# Patient Record
Sex: Female | Born: 1990 | Race: Black or African American | Hispanic: No | Marital: Single | Smoking: Never smoker
Health system: Southern US, Community
[De-identification: ages and names within clinical notes are randomized; demographics above are authoritative.]

---
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
## 2012-02-03 ENCOUNTER — Ambulatory Visit (INDEPENDENT_AMBULATORY_CARE_PROVIDER_SITE_OTHER): Payer: Federal, State, Local not specified - PPO

## 2012-02-03 ENCOUNTER — Ambulatory Visit: Payer: Federal, State, Local not specified - PPO

## 2012-03-01 ENCOUNTER — Ambulatory Visit (INDEPENDENT_AMBULATORY_CARE_PROVIDER_SITE_OTHER): Payer: Federal, State, Local not specified - PPO | Admitting: Physician Assistant

## 2012-03-01 DIAGNOSIS — IMO0001 Reserved for inherently not codable concepts without codable children: Secondary | ICD-10-CM

## 2012-03-01 DIAGNOSIS — Z309 Encounter for contraceptive management, unspecified: Secondary | ICD-10-CM

## 2012-03-01 MED ORDER — MEDROXYPROGESTERONE ACETATE 150 MG/ML IM SUSP
150.0000 mg | Freq: Once | INTRAMUSCULAR | Status: AC
  Administered 2012-03-01: 150 mg via INTRAMUSCULAR

## 2012-03-01 NOTE — Progress Notes (Signed)
   Patient ID: Breanna Holmes MRN: [id number], DOB: Dec 27, 1990, 21 y.o. Date of Encounter: 03/01/2012, 7:46 AM  Primary Physician: No primary provider on file.  Chief Complaint: Here for Depo-Provera injection  21 y.o. year old female here for Depo-Provera injection. Outside of her window. Last injection was 11/27/11. Window was 6/28-7/12. Patient needs a pregnancy test prior to administration of Depo-Provera. Pregnancy test negative. Ok to give Depo-Provera injection. Next injection due on 10/1-10/15. Patient was advised to use back up method for 7 days and repeat pregnancy test in 2 weeks. [facility] In was inadvertently Rx'd in April.    Results for orders placed in visit on 03/01/12  POCT URINE PREGNANCY      Component Value Range   Preg Test, Ur Negative      Signed, Eula Listen, PA-C 03/01/2012 7:46 AM

## 2012-05-26 ENCOUNTER — Encounter: Payer: Self-pay | Admitting: Physician Assistant

## 2012-05-26 ENCOUNTER — Ambulatory Visit (INDEPENDENT_AMBULATORY_CARE_PROVIDER_SITE_OTHER): Payer: Federal, State, Local not specified - PPO | Admitting: Physician Assistant

## 2012-05-26 VITALS — BP 106/60 | HR 92 | Temp 99.0°F | Resp 16 | Ht 64.0 in | Wt 138.2 lb

## 2012-05-26 DIAGNOSIS — Z3049 Encounter for surveillance of other contraceptives: Secondary | ICD-10-CM

## 2012-05-26 DIAGNOSIS — IMO0001 Reserved for inherently not codable concepts without codable children: Secondary | ICD-10-CM

## 2012-05-26 MED ORDER — MEDROXYPROGESTERONE ACETATE 150 MG/ML IM SUSP: 150.0000 mg | Freq: Once | INTRAMUSCULAR | Status: DC

## 2012-05-26 MED ORDER — MEDROXYPROGESTERONE ACETATE 150 MG/ML IM SUSP
150.0000 mg | Freq: Once | INTRAMUSCULAR | Status: AC
  Administered 2012-05-26: 150 mg via INTRAMUSCULAR

## 2012-05-26 NOTE — Progress Notes (Signed)
Here for Depo-Provera injection. Last dose 03/01/12. Ok to give. Return in 3 months for next injection.

## 2012-06-19 ENCOUNTER — Emergency Department (HOSPITAL_COMMUNITY): Payer: Federal, State, Local not specified - PPO

## 2012-06-19 ENCOUNTER — Emergency Department (HOSPITAL_COMMUNITY)
Admission: EM | Admit: 2012-06-19 | Discharge: 2012-06-19 | Disposition: A | Discharge status: Home / Self Care | Payer: Federal, State, Local not specified - PPO | Attending: Emergency Medicine | Admitting: Emergency Medicine

## 2012-06-19 ENCOUNTER — Encounter (HOSPITAL_COMMUNITY): Payer: Self-pay | Admitting: *Deleted

## 2012-06-19 DIAGNOSIS — S40019A Contusion of unspecified shoulder, initial encounter: Secondary | ICD-10-CM | POA: Insufficient documentation

## 2012-06-19 DIAGNOSIS — S40012A Contusion of left shoulder, initial encounter: Secondary | ICD-10-CM

## 2012-06-19 MED ORDER — IBUPROFEN 400 MG PO TABS
600.0000 mg | ORAL_TABLET | Freq: Once | ORAL | Status: AC
  Administered 2012-06-19: 600 mg via ORAL
  Filled 2012-06-19: qty 1

## 2012-06-19 MED ORDER — IBUPROFEN 600 MG PO TABS: 600.0000 mg | ORAL_TABLET | Freq: Four times a day (QID) | ORAL | Status: DC | PRN

## 2012-06-19 NOTE — Progress Notes (Signed)
Orthopedic Tech Progress Note Patient Details:  Breanna Holmes [birth year] [id number]  Ortho Devices Type of Ortho Device: Arm foam sling Ortho Device/Splint Location: (L) UE Ortho Device/Splint Interventions: Application   Jennye Moccasin 06/19/2012, 1:36 PM
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
## 2012-06-19 NOTE — Discharge Instructions (Signed)

## 2012-08-22 ENCOUNTER — Ambulatory Visit (INDEPENDENT_AMBULATORY_CARE_PROVIDER_SITE_OTHER): Payer: Federal, State, Local not specified - PPO | Admitting: Radiology

## 2012-08-22 DIAGNOSIS — IMO0001 Reserved for inherently not codable concepts without codable children: Secondary | ICD-10-CM

## 2012-08-22 DIAGNOSIS — Z309 Encounter for contraceptive management, unspecified: Secondary | ICD-10-CM

## 2012-08-22 DIAGNOSIS — Z23 Encounter for immunization: Secondary | ICD-10-CM

## 2012-08-22 MED ORDER — MEDROXYPROGESTERONE ACETATE 150 MG/ML IM SUSP
150.0000 mg | Freq: Once | INTRAMUSCULAR | Status: AC
  Administered 2012-08-22: 150 mg via INTRAMUSCULAR

## 2012-08-22 NOTE — Addendum Note (Signed)
Addended by: Marinus Maw on: 08/22/2012 03:30 PM   Modules accepted: Level of Service

## 2012-09-14 ENCOUNTER — Telehealth: Payer: Self-pay

## 2012-09-14 MED ORDER — MEDROXYPROGESTERONE ACETATE 150 MG/ML IM SUSP: 150.0000 mg | INTRAMUSCULAR | Status: DC

## 2012-09-14 NOTE — Telephone Encounter (Signed)
Patient's mother notified and voiced understanding.

## 2012-09-14 NOTE — Telephone Encounter (Signed)
Can we send order for depo provera?

## 2012-09-14 NOTE — Telephone Encounter (Signed)
ANGELA STATES HER DAUGHTER GET HER DEPO SHOTS HERE AND SHE IS GOING TO [employer] THE 4TH OF February, WOULD LIKE TO KNOW IF WE CALL IN A PRESCRIPTION FOR THAT SO THEY CAN GIVE TO HER THERE. PLEASE CALL [phone]   [facility] [address]

## 2012-09-14 NOTE — Telephone Encounter (Signed)
Rx sent to pharmacy. She will be due for annual exam in April, so should plan to come in for that, or establish somewhere if she will no longer be in town.

## 2013-05-14 ENCOUNTER — Ambulatory Visit (INDEPENDENT_AMBULATORY_CARE_PROVIDER_SITE_OTHER): Payer: Federal, State, Local not specified - PPO | Admitting: Emergency Medicine

## 2013-05-14 VITALS — BP 112/64 | HR 88 | Temp 99.2°F | Resp 20 | Ht 64.0 in | Wt 153.6 lb

## 2013-05-14 DIAGNOSIS — H66009 Acute suppurative otitis media without spontaneous rupture of ear drum, unspecified ear: Secondary | ICD-10-CM

## 2013-05-14 MED ORDER — AMOXICILLIN 500 MG PO CAPS: 500.0000 mg | ORAL_CAPSULE | Freq: Three times a day (TID) | ORAL | Status: DC

## 2013-05-14 NOTE — Patient Instructions (Addendum)

## 2013-05-14 NOTE — Progress Notes (Signed)
[facility] [address], [place] [address] [phone]  Date:  05/14/2013   Name:  Breanna Holmes   DOB:  13-May-1991   MRN:  [id number]  PCP:  No PCP Per Patient    Chief Complaint: Otitis Media   History of Present Illness:  Breanna Holmes is a 22 y.o. very pleasant female patient who presents with the following:  Pain in right ear since a week ago. Has sore throat.  No fever or chills. No coryza.  No cough, wheezing or shortness of breath.  No nausea or vomiting.  No stool change or rash.  No improvement with over the counter medications or other home remedies. Denies other complaint or health concern today.   There are no active problems to display for this patient.   History reviewed. No pertinent past medical history.  History reviewed. No pertinent past surgical history.  History  Substance Use Topics  . Smoking status: Never Smoker   . Smokeless tobacco: Not on file  . Alcohol Use: No    Family History  Problem Relation Age of Onset  . Hypertension Mother   . Hypertension Maternal Grandfather     No Known Allergies  Medication list has been reviewed and updated.  Current Outpatient Prescriptions on File Prior to Visit  Medication Sig Dispense Refill  . ibuprofen (ADVIL,MOTRIN) 600 MG tablet Take 1 tablet (600 mg total) by mouth every 6 (six) hours as needed for pain.  30 tablet  0  . medroxyPROGESTERone (DEPO-PROVERA) 150 MG/ML injection Inject 1 mL (150 mg total) into the muscle every 3 (three) months.  1 mL  5   No current facility-administered medications on file prior to visit.    Review of Systems:  As per HPI, otherwise negative.    Physical Examination: Filed Vitals:   05/14/13 1227  BP: 112/64  Pulse: 88  Temp: 99.2 F (37.3 C)  Resp: 20   Filed Vitals:   05/14/13 1227  Height: 5\' 4"  (1.626 m)  Weight: 153 lb 9.6 oz (69.673 kg)   Body mass index is 26.35 kg/(m^2). Ideal Body Weight: Weight in (lb) to  have BMI = 25: 145.3  GEN: WDWN, NAD, Non-toxic, A & O x 3 HEENT: Atraumatic, Normocephalic. Neck supple. No masses, No LAD. Ears and Nose: No external deformity.  Right otitis media  CV: RRR, No M/G/R. No JVD. No thrill. No extra heart sounds. PULM: CTA B, no wheezes, crackles, rhonchi. No retractions. No resp. distress. No accessory muscle use. ABD: S, NT, ND, +BS. No rebound. No HSM. EXTR: No c/c/e NEURO Normal gait.  PSYCH: Normally interactive. Conversant. Not depressed or anxious appearing.  Calm demeanor.    Assessment and Plan: Right otitis media Amoxicillin   Signed,  Phillips Odor, MD

## 2013-05-15 ENCOUNTER — Telehealth: Payer: Self-pay

## 2013-05-15 MED ORDER — HYDROCODONE-ACETAMINOPHEN 5-325 MG PO TABS
1.0000 | ORAL_TABLET | Freq: Four times a day (QID) | ORAL | Status: DC | PRN
Start: 2013-05-15 — End: 2015-10-30

## 2013-05-15 NOTE — Telephone Encounter (Signed)
ANGELA STATES HER DAUGHTER WAS SEEN FOR AN EARACHE AND GIVEN ANTIBIOTICS BUT SHE IS IN A LOT OF PAIN, CRIED ALL NIGHT, WOULD LIKE TO HAVE SOMETHING CALLED IN FOR PAIN. PLEASE CALL [phone]    [facility] [address]

## 2013-05-15 NOTE — Telephone Encounter (Signed)
Mother advised this has been sent.

## 2013-05-15 NOTE — Telephone Encounter (Signed)
rx printed.  Meds ordered this encounter  Medications  . HYDROcodone-acetaminophen (NORCO) 5-325 MG per tablet    Sig: Take 1 tablet by mouth every 6 (six) hours as needed for pain.    Dispense:  15 tablet    Refill:  0    Order Specific Question:  Supervising Provider    Answer:  DOOLITTLE, ROBERT P [[id number]]

## 2013-05-22 ENCOUNTER — Ambulatory Visit (INDEPENDENT_AMBULATORY_CARE_PROVIDER_SITE_OTHER): Payer: Federal, State, Local not specified - PPO | Admitting: Family Medicine

## 2013-05-22 VITALS — BP 120/70 | HR 75 | Temp 98.9°F | Resp 16 | Ht 63.5 in | Wt 152.0 lb

## 2013-05-22 DIAGNOSIS — G510 Bell's palsy: Secondary | ICD-10-CM

## 2013-05-22 MED ORDER — PREDNISONE 20 MG PO TABS: ORAL_TABLET | ORAL | Status: DC

## 2013-05-22 NOTE — Patient Instructions (Signed)
Bell's Palsy  Bell's palsy is a condition in which the muscles on one side of the face cannot move (paralysis). This is because the nerves in the face are paralyzed. It is most often thought to be caused by a virus. The virus causes swelling of the nerve that controls movement on one side of the face. The nerve travels through a tight space surrounded by bone. When the nerve swells, it can be compressed by the bone. This results in damage to the protective covering around the nerve. This damage interferes with how the nerve communicates with the muscles of the face. As a result, it can cause weakness or paralysis of the facial muscles.   Injury (trauma), tumor, and surgery may cause Bell's palsy, but most of the time the cause is unknown. It is a relatively common condition. It starts suddenly (abrupt onset) with the paralysis usually ending within 2 days. Bell's palsy is not dangerous. But because the eye does not close properly, you may need care to keep the eye from getting dry. This can include splinting (to keep the eye shut) or moistening with artificial tears. Bell's palsy very seldom occurs on both sides of the face at the same time.  SYMPTOMS    Eyebrow sagging.   Drooping of the eyelid and corner of the mouth.   Inability to close one eye.   Loss of taste on the front of the tongue.   Sensitivity to loud noises.  TREATMENT   The treatment is usually non-surgical. If the patient is seen within the first 24 to 48 hours, a short course of steroids may be prescribed, in an attempt to shorten the length of the condition. Antiviral medicines may also be used with the steroids, but it is unclear if they are helpful.   You will need to protect your eye, if you cannot close it. The cornea (clear covering over your eye) will become dry and can be damaged. Artificial tears can be used to keep your eye moist. Glasses or an eye patch should be worn to protect your eye.  PROGNOSIS   Recovery is variable, ranging  from days to months. Although the problem usually goes away completely (about 80% of cases resolve), predicting the outcome is impossible. Most people improve within 3 weeks of when the symptoms began. Improvement may continue for 3 to 6 months. A small number of people have moderate to severe weakness that is permanent.   HOME CARE INSTRUCTIONS    If your caregiver prescribed medication to reduce swelling in the nerve, use as directed. Do not stop taking the medication unless directed by your caregiver.   Use moisturizing eye drops as needed to prevent drying of your eye, as directed by your caregiver.   Protect your eye, as directed by your caregiver.   Use facial massage and exercises, as directed by your caregiver.   Perform your normal activities, and get your normal rest.  SEEK IMMEDIATE MEDICAL CARE IF:    There is pain, redness or irritation in the eye.   You or your child has an oral temperature above 102 F (38.9 C), not controlled by medicine.  MAKE SURE YOU:    Understand these instructions.   Will watch your condition.   Will get help right away if you are not doing well or get worse.  Document Released: 08/03/2005 Document Revised: 10/26/2011 Document Reviewed: 08/12/2009  ExitCare Patient Information 2014 [employer].
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
## 2013-06-28 IMAGING — CR DG SHOULDER 2+V*L*
3 series · 3 of 3 positions shown · non-contrast
Comparison: None

CLINICAL DATA: Left shoulder pain.

LEFT SHOULDER - 2+ VIEW

[w shoulder internal left]
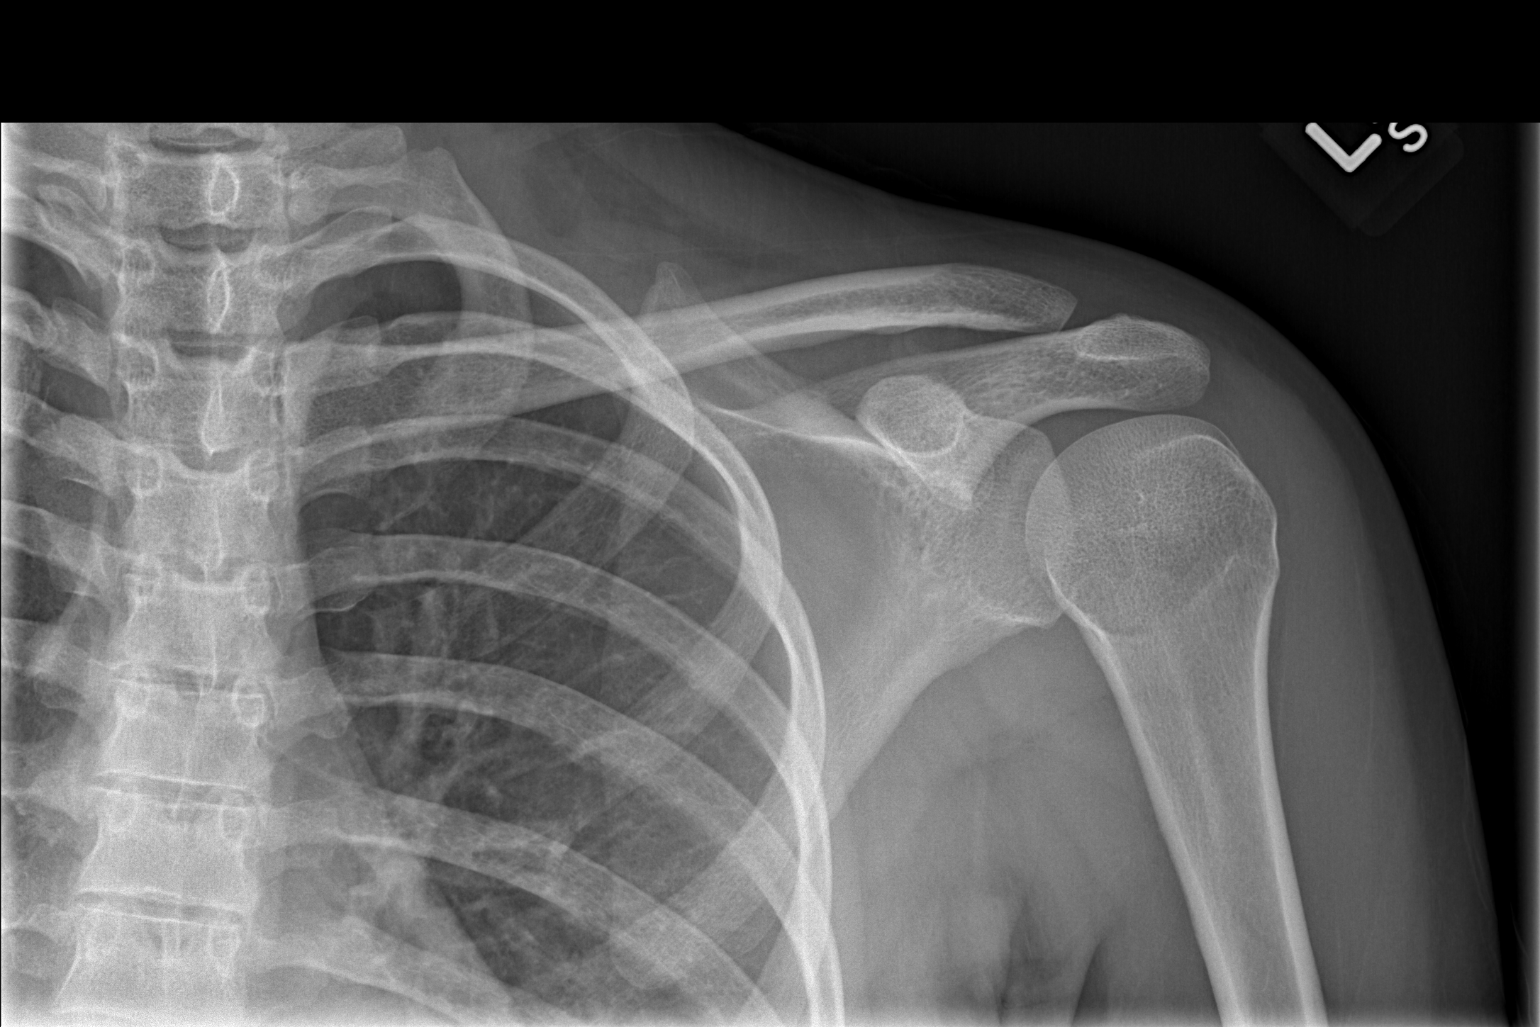

[w shoulder external left]
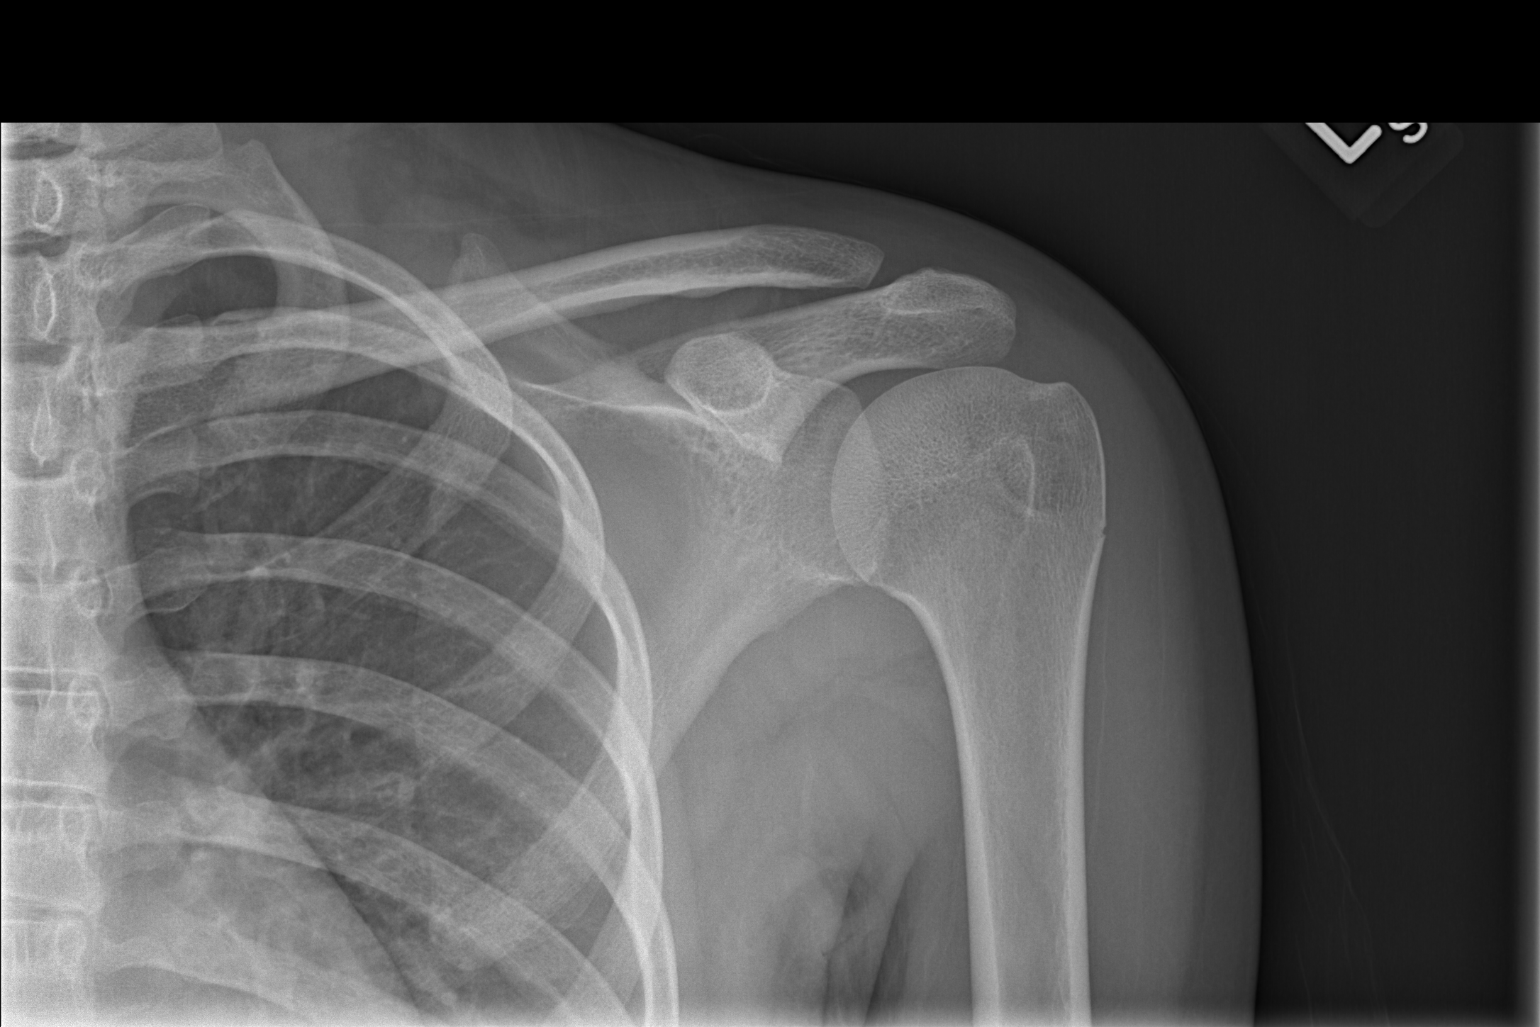

[w shoulder y-view left]
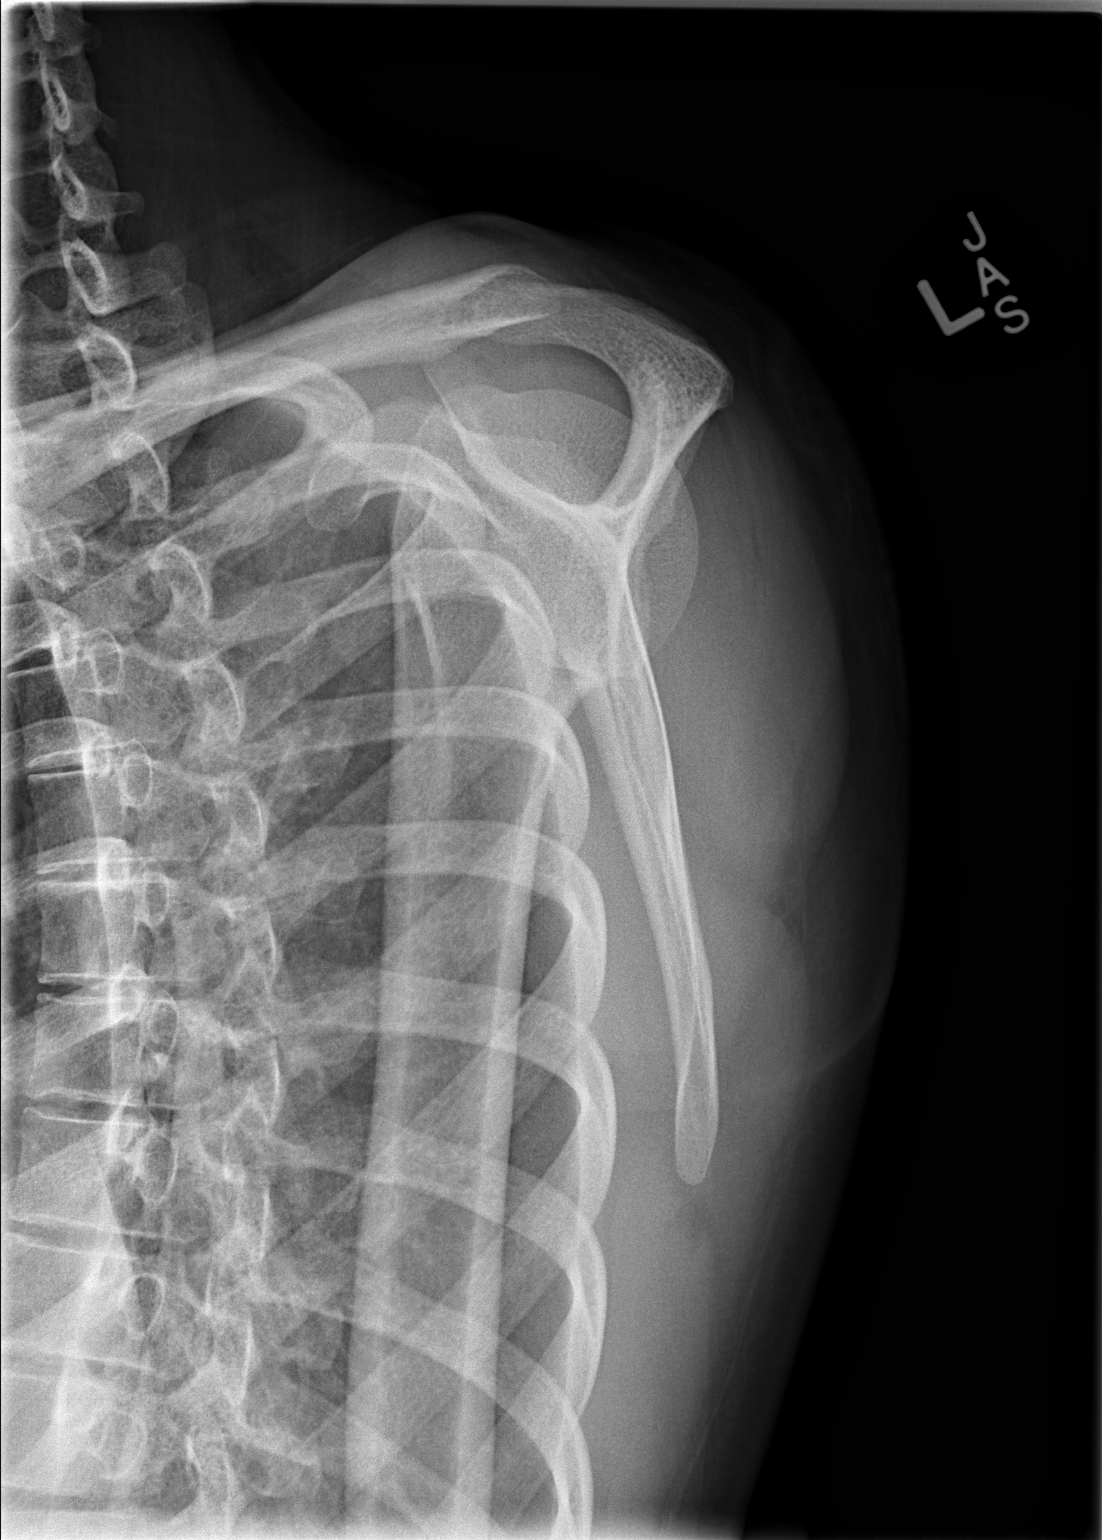

[3 of 3 positions shown; findings below may reference images not displayed]

FINDINGS: The joint spaces are maintained.  No acute fracture or
dislocation.  No abnormal soft tissue calcifications.  The left
upper ribs are intact and the left lung apex is clear.
IMPRESSION: No acute bony findings.

## 2014-03-09 ENCOUNTER — Emergency Department (HOSPITAL_COMMUNITY)
Admission: EM | Admit: 2014-03-09 | Discharge: 2014-03-09 | Disposition: A | Discharge status: Home / Self Care | Payer: Federal, State, Local not specified - PPO | Attending: Emergency Medicine | Admitting: Emergency Medicine

## 2014-03-09 ENCOUNTER — Encounter (HOSPITAL_COMMUNITY): Payer: Self-pay | Admitting: Emergency Medicine

## 2014-03-09 DIAGNOSIS — IMO0002 Reserved for concepts with insufficient information to code with codable children: Secondary | ICD-10-CM | POA: Insufficient documentation

## 2014-03-09 DIAGNOSIS — Z792 Long term (current) use of antibiotics: Secondary | ICD-10-CM | POA: Insufficient documentation

## 2014-03-09 DIAGNOSIS — Z113 Encounter for screening for infections with a predominantly sexual mode of transmission: Secondary | ICD-10-CM | POA: Insufficient documentation

## 2014-03-09 LAB — WET PREP, GENITAL: Trich, Wet Prep: NONE SEEN

## 2014-03-09 NOTE — ED Provider Notes (Signed)
CSN: [id number]     Arrival date & time 03/09/14  1653 History   First MD Initiated Contact with Patient 03/09/14 2004     Chief Complaint  Patient presents with  . SEXUALLY TRANSMITTED DISEASE     (Consider location/radiation/quality/duration/timing/severity/associated sxs/prior Treatment) HPI Comments:  Patient is in the emergency room tonight requesting a routine STD check.  She is symptom free and has no known exposure risk  She gets tested regularly.  She happened to be in the emergency department, with a friend who is getting tested.  The history is provided by the patient.    History reviewed. No pertinent past medical history. History reviewed. No pertinent past surgical history. Family History  Problem Relation Age of Onset  . Hypertension Mother   . Hypertension Maternal Grandfather    History  Substance Use Topics  . Smoking status: Never Smoker   . Smokeless tobacco: Not on file  . Alcohol Use: No   OB History   Grav Para Term Preterm Abortions TAB SAB Ect Mult Living                 Review of Systems  Constitutional: Negative for fever and chills.  Gastrointestinal: Negative for abdominal pain.  Genitourinary: Negative for dysuria, vaginal bleeding and vaginal discharge.  All other systems reviewed and are negative.     Allergies  Review of patient's allergies indicates no known allergies.  Home Medications   Prior to Admission medications   Medication Sig Start Date End Date Taking? Authorizing Provider  amoxicillin (AMOXIL) 500 MG capsule Take 1 capsule (500 mg total) by mouth 3 (three) times daily. 05/14/13   Phillips Odor, MD  HYDROcodone-acetaminophen (NORCO) 5-325 MG per tablet Take 1 tablet by mouth every 6 (six) hours as needed for pain. 05/15/13   Chelle S Jeffery, PA-C  ibuprofen (ADVIL,MOTRIN) 600 MG tablet Take 1 tablet (600 mg total) by mouth every 6 (six) hours as needed for pain. 06/19/12   Loren Racer, MD  medroxyPROGESTERone  (DEPO-PROVERA) 150 MG/ML injection Inject 1 mL (150 mg total) into the muscle every 3 (three) months. 09/14/12   Nelva Nay, PA-C  predniSONE (DELTASONE) 20 MG tablet 2 daily with food 05/22/13   Elvina Sidle, MD   BP 154/64  Pulse 78  Temp(Src) 98 F (36.7 C) (Oral)  Resp 18  SpO2 100% Physical Exam  Nursing note and vitals reviewed. Constitutional: She appears well-developed and well-nourished.  HENT:  Head: Normocephalic.  Eyes: Pupils are equal, round, and reactive to light.  Neck: Normal range of motion.  Cardiovascular: Normal rate.   Pulmonary/Chest: Effort normal.  Abdominal: Soft. Bowel sounds are normal.  Genitourinary: Vagina normal. No vaginal discharge found.  Musculoskeletal: Normal range of motion.  Neurological: She is alert.  Skin: Skin is warm. No rash noted.    ED Course  Procedures (including critical care time) Labs Review Labs Reviewed  WET PREP, GENITAL - Abnormal; Notable for the following:    Yeast Wet Prep HPF POC FEW (*)    Clue Cells Wet Prep HPF POC FEW (*)    WBC, Wet Prep HPF POC MODERATE (*)    All other components within normal limits  GC/CHLAMYDIA PROBE AMP  RPR  HIV ANTIBODY (ROUTINE TESTING)    Imaging Review No results found.   EKG Interpretation None      MDM   Final diagnoses:  Screen for STD (sexually transmitted disease)         Cipriano Mile  Manus RuddSchulz, NP 03/09/14 2114

## 2014-03-09 NOTE — ED Notes (Signed)
Dondra SpryGail, NP, is at the bedside.

## 2014-03-09 NOTE — Discharge Instructions (Signed)
U. been screened for STD cultures are pending.  These will be resulted in the next 3-5 days

## 2014-03-09 NOTE — ED Notes (Signed)
Pt in requesting an STD check, denies symptoms, states she doesn't know if she has been exposed to something.

## 2014-03-09 NOTE — ED Notes (Signed)
Patient prepared for pelvic exam, cart at the bedside.

## 2014-03-10 LAB — GC/CHLAMYDIA PROBE AMP
CT Probe RNA: POSITIVE — AB
GC PROBE AMP APTIMA: NEGATIVE

## 2014-03-10 LAB — HIV ANTIBODY (ROUTINE TESTING W REFLEX): HIV 1&2 Ab, 4th Generation: NONREACTIVE

## 2014-03-10 LAB — RPR

## 2014-03-10 NOTE — ED Provider Notes (Signed)
Medical screening examination/treatment/procedure(s) were performed by non-physician practitioner and as supervising physician I was immediately available for consultation/collaboration.     Dayvin Aber, MD 03/10/14 1728 

## 2014-03-11 ENCOUNTER — Telehealth (HOSPITAL_BASED_OUTPATIENT_CLINIC_OR_DEPARTMENT_OTHER): Payer: Self-pay | Admitting: Emergency Medicine

## 2014-03-11 NOTE — Telephone Encounter (Signed)
+  Chlamydia Chart sent to EDP office for review.  

## 2014-03-13 NOTE — Telephone Encounter (Signed)
Rx for Azithromycin 1000 mg PO x 1 dose, prescribed by Sharilyn SitesLisa Sanders PA-C, [facility] [address] by Jaci Lazieroni Festerman RN.

## 2014-03-13 NOTE — Telephone Encounter (Signed)
Chart returned from EDP office. Per Sharilyn SitesLisa Sanders PA-C, Azithromycin 100 mg PO x 1 dose.

## 2014-04-05 DIAGNOSIS — T2020XA Burn of second degree of head, face, and neck, unspecified site, initial encounter: Secondary | ICD-10-CM | POA: Insufficient documentation

## 2015-10-30 ENCOUNTER — Inpatient Hospital Stay (HOSPITAL_COMMUNITY): Payer: Federal, State, Local not specified - PPO

## 2015-10-30 ENCOUNTER — Encounter (HOSPITAL_COMMUNITY): Payer: Self-pay | Admitting: *Deleted

## 2015-10-30 ENCOUNTER — Inpatient Hospital Stay (HOSPITAL_COMMUNITY)
Admission: AD | Admit: 2015-10-30 | Discharge: 2015-10-30 | Disposition: A | Discharge status: Home / Self Care | Payer: Federal, State, Local not specified - PPO | Source: Ambulatory Visit | Attending: Obstetrics & Gynecology | Admitting: Obstetrics & Gynecology

## 2015-10-30 DIAGNOSIS — O26891 Other specified pregnancy related conditions, first trimester: Secondary | ICD-10-CM

## 2015-10-30 DIAGNOSIS — O210 Mild hyperemesis gravidarum: Secondary | ICD-10-CM | POA: Insufficient documentation

## 2015-10-30 DIAGNOSIS — R102 Pelvic and perineal pain: Secondary | ICD-10-CM | POA: Insufficient documentation

## 2015-10-30 DIAGNOSIS — Z3A10 10 weeks gestation of pregnancy: Secondary | ICD-10-CM | POA: Insufficient documentation

## 2015-10-30 DIAGNOSIS — Z3490 Encounter for supervision of normal pregnancy, unspecified, unspecified trimester: Secondary | ICD-10-CM

## 2015-10-30 DIAGNOSIS — O219 Vomiting of pregnancy, unspecified: Secondary | ICD-10-CM | POA: Diagnosis not present

## 2015-10-30 LAB — CBC
HCT: 36.5 % (ref 36.0–46.0)
Hemoglobin: 12.6 g/dL (ref 12.0–15.0)
MCH: 28 pg (ref 26.0–34.0)
MCHC: 34.5 g/dL (ref 30.0–36.0)
MCV: 81.1 fL (ref 78.0–100.0)
Platelets: 214 10*3/uL (ref 150–400)
RBC: 4.5 MIL/uL (ref 3.87–5.11)
RDW: 12 % (ref 11.5–15.5)
WBC: 7.7 10*3/uL (ref 4.0–10.5)

## 2015-10-30 LAB — URINALYSIS, ROUTINE W REFLEX MICROSCOPIC
BILIRUBIN URINE: NEGATIVE
GLUCOSE, UA: NEGATIVE mg/dL
HGB URINE DIPSTICK: NEGATIVE
Ketones, ur: 15 mg/dL — AB
Leukocytes, UA: NEGATIVE
Nitrite: NEGATIVE
PROTEIN: NEGATIVE mg/dL
SPECIFIC GRAVITY, URINE: 1.025 (ref 1.005–1.030)
pH: 6 (ref 5.0–8.0)

## 2015-10-30 LAB — WET PREP, GENITAL
Clue Cells Wet Prep HPF POC: NONE SEEN
Sperm: NONE SEEN
TRICH WET PREP: NONE SEEN
YEAST WET PREP: NONE SEEN

## 2015-10-30 LAB — POCT PREGNANCY, URINE: PREG TEST UR: POSITIVE — AB

## 2015-10-30 LAB — HCG, QUANTITATIVE, PREGNANCY: HCG, BETA CHAIN, QUANT, S: 145752 m[IU]/mL — AB (ref <5)

## 2015-10-30 MED ORDER — PROMETHAZINE HCL 25 MG PO TABS: 12.5000 mg | ORAL_TABLET | Freq: Four times a day (QID) | ORAL | Status: DC | PRN

## 2015-10-30 NOTE — MAU Provider Note (Signed)
History     CSN: [id number]  Arrival date and time: 10/30/15 2000   First Provider Initiated Contact with Patient 10/30/15 2057      Chief Complaint  Patient presents with  . Pelvic Pain   Pelvic Pain The patient's primary symptoms include pelvic pain. This is a new problem. The current episode started in the past 7 days. The problem occurs intermittently. The problem has been unchanged. Pain severity now: 5/10  The problem affects both sides. She is pregnant. Associated symptoms include abdominal pain, nausea and vomiting. Pertinent negatives include no chills, constipation, diarrhea, dysuria, fever, frequency or urgency. The vaginal discharge was normal. There has been no bleeding. Nothing aggravates the symptoms. Treatments tried: laying on her side helps her feel better.  The treatment provided mild relief. She uses nothing (last had a depo injection in November. ) for contraception. Her menstrual history has been irregular.  Emesis  This is a new problem. The current episode started in the past 7 days. The problem occurs 2 to 4 times per day. The problem has been unchanged. The emesis has an appearance of stomach contents. There has been no fever. Associated symptoms include abdominal pain. Pertinent negatives include no chills, diarrhea or fever. Risk factors: pregnancy  She has tried nothing for the symptoms. The treatment provided no relief.   History reviewed. No pertinent past medical history.  History reviewed. No pertinent past surgical history.  Family History  Problem Relation Age of Onset  . Hypertension Mother   . Hypertension Maternal Grandfather     Social History  Substance Use Topics  . Smoking status: Never Smoker   . Smokeless tobacco: None  . Alcohol Use: No    Allergies: No Known Allergies  Prescriptions prior to admission  Medication Sig Dispense Refill Last Dose  . amoxicillin (AMOXIL) 500 MG capsule Take 1 capsule (500 mg total) by mouth 3 (three)  times daily. 30 capsule 0 Not Taking  . HYDROcodone-acetaminophen (NORCO) 5-325 MG per tablet Take 1 tablet by mouth every 6 (six) hours as needed for pain. 15 tablet 0 Not Taking  . ibuprofen (ADVIL,MOTRIN) 600 MG tablet Take 1 tablet (600 mg total) by mouth every 6 (six) hours as needed for pain. 30 tablet 0 Not Taking  . medroxyPROGESTERone (DEPO-PROVERA) 150 MG/ML injection Inject 1 mL (150 mg total) into the muscle every 3 (three) months. 1 mL 5 Taking  . predniSONE (DELTASONE) 20 MG tablet 2 daily with food 10 tablet 1     Review of Systems  Constitutional: Negative for fever and chills.  Gastrointestinal: Positive for nausea, vomiting and abdominal pain. Negative for diarrhea and constipation.  Genitourinary: Positive for pelvic pain. Negative for dysuria, urgency and frequency.   Physical Exam   Blood pressure 127/67, pulse 86, temperature 97.9 F (36.6 C), temperature source Oral, resp. rate 18, height 5\' 4"  (1.626 m), weight 72.848 kg (160 lb 9.6 oz).  Physical Exam  Nursing note and vitals reviewed. Constitutional: She is oriented to person, place, and time. She appears well-developed and well-nourished. No distress.  HENT:  Head: Normocephalic.  Cardiovascular: Normal rate.   Respiratory: Effort normal.  GI: Soft. There is no tenderness. There is no rebound.  Neurological: She is alert and oriented to person, place, and time.  Skin: Skin is warm and dry.  Psychiatric: She has a normal mood and affect.   [place] Ob Comp Less 14 Wks  10/30/2015  CLINICAL DATA:  Low abdominal pain, unsure of LMP. Quantitative  beta HCG pending EXAM: OBSTETRIC <14 WK [place] AND TRANSVAGINAL OB US TECHNIQUE: Both transabdominal and transvaginal ultrasound examinations were performed for complete evaluation of the gestation as well as the maternal uterus, adnexal regions, and pelvic cul-de-sac. Transvaginal technique was performed to assess early pregnancy. COMPARISON:  None. FINDINGS: Intrauterine  gestational sac: Visualized/normal in shape. Yolk sac:  Visualized Embryo:  Visualized Cardiac Activity: Present Heart Rate: 166  bpm MSD:   mm    w     d CRL:  31  mm   10 w   0 d                  [place] EDC: 05/27/2016 Subchorionic hemorrhage:  None visualized. Maternal uterus/adnexae: Maternal ovaries are not seen but there is no mass or free fluid identified within either adnexal region. No free fluid in the cul-de-sac. IMPRESSION: Single live intrauterine pregnancy with estimated gestational age of [redacted] weeks and 0 days by crown-rump length measurement. Fetal heart rate measured at 166 beats per minute. Electronically Signed   By: Bary Richard M.D.   On: 10/30/2015 21:38   Results for orders placed or performed during the hospital encounter of 10/30/15 (from the past 24 hour(s))  Urinalysis, Routine w reflex microscopic (not at [facility])     Status: Abnormal   Collection Time: 10/30/15  8:12 PM  Result Value Ref Range   Color, Urine YELLOW YELLOW   APPearance CLEAR CLEAR   Specific Gravity, Urine 1.025 1.005 - 1.030   pH 6.0 5.0 - 8.0   Glucose, UA NEGATIVE NEGATIVE mg/dL   Hgb urine dipstick NEGATIVE NEGATIVE   Bilirubin Urine NEGATIVE NEGATIVE   Ketones, ur 15 (A) NEGATIVE mg/dL   Protein, ur NEGATIVE NEGATIVE mg/dL   Nitrite NEGATIVE NEGATIVE   Leukocytes, UA NEGATIVE NEGATIVE  Pregnancy, urine POC     Status: Abnormal   Collection Time: 10/30/15  8:22 PM  Result Value Ref Range   Preg Test, Ur POSITIVE (A) NEGATIVE  CBC     Status: None   Collection Time: 10/30/15  8:38 PM  Result Value Ref Range   WBC 7.7 4.0 - 10.5 K/uL   RBC 4.50 3.87 - 5.11 MIL/uL   Hemoglobin 12.6 12.0 - 15.0 g/dL   HCT 16.1 09.6 - 04.5 %   MCV 81.1 78.0 - 100.0 fL   MCH 28.0 26.0 - 34.0 pg   MCHC 34.5 30.0 - 36.0 g/dL   RDW 40.9 81.1 - 91.4 %   Platelets 214 150 - 400 K/uL  hCG, quantitative, pregnancy     Status: Abnormal   Collection Time: 10/30/15  8:38 PM  Result Value Ref Range   hCG, Beta Chain, Quant,  S 145752 (H) <5 mIU/mL  ABO/Rh     Status: None (Preliminary result)   Collection Time: 10/30/15  8:41 PM  Result Value Ref Range   ABO/RH(D) O POS   Wet prep, genital     Status: Abnormal   Collection Time: 10/30/15  8:55 PM  Result Value Ref Range   Yeast Wet Prep HPF POC NONE SEEN NONE SEEN   Trich, Wet Prep NONE SEEN NONE SEEN   Clue Cells Wet Prep HPF POC NONE SEEN NONE SEEN   WBC, Wet Prep HPF POC MODERATE (A) NONE SEEN   Sperm NONE SEEN      MAU Course  Procedures  MDM   Assessment and Plan   1. [redacted] weeks gestation of pregnancy   2. Pelvic pain affecting  pregnancy in first trimester, antepartum   3. Intrauterine pregnancy   4. Nausea/vomiting in pregnancy    DC home Comfort measures reviewed  1st Trimester precautions  RX: phenergan prn #30  Return to MAU as needed FU with OB as planned  Follow-up Information    Follow up with [facility].   Why:  As needed   Contact information:   [address] [place] [address] [phone]         Tawnya Crook 10/30/2015, 9:00 PM

## 2015-10-30 NOTE — MAU Note (Signed)
Pt presents complaining of lower abdominal pain. +HPT. LMP unknown due to Depo. Last on time depo was in November. Denies bleeding or abnormal discharge.

## 2015-10-30 NOTE — Discharge Instructions (Signed)

## 2015-10-31 LAB — GC/CHLAMYDIA PROBE AMP (~~LOC~~) NOT AT ARMC
Chlamydia: NEGATIVE
Neisseria Gonorrhea: NEGATIVE

## 2015-10-31 LAB — ABO/RH: ABO/RH(D): O POS

## 2015-10-31 LAB — HIV ANTIBODY (ROUTINE TESTING W REFLEX): HIV Screen 4th Generation wRfx: NONREACTIVE

## 2015-10-31 LAB — RPR: RPR Ser Ql: NONREACTIVE

## 2015-11-15 ENCOUNTER — Other Ambulatory Visit: Payer: Self-pay | Admitting: Advanced Practice Midwife

## 2016-11-07 IMAGING — US US OB COMP LESS 14 WK
1 series · 15 of 28 positions shown · non-contrast
Comparison: None.

CLINICAL DATA: Low abdominal pain, unsure of LMP. Quantitative beta
HCG pending

EXAM:
OBSTETRIC <14 WK US AND TRANSVAGINAL OB US
TECHNIQUE: Both transabdominal and transvaginal ultrasound examinations were
performed for complete evaluation of the gestation as well as the
maternal uterus, adnexal regions, and pelvic cul-de-sac.
Transvaginal technique was performed to assess early pregnancy.

[Series 1: us ob comp less 14 wk · 36 acquisitions, 15 frames shown]
[im 1/36]
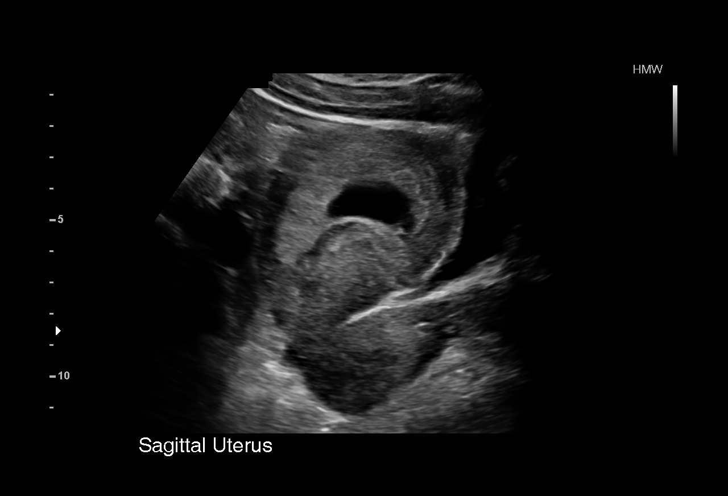
[im 3/36]
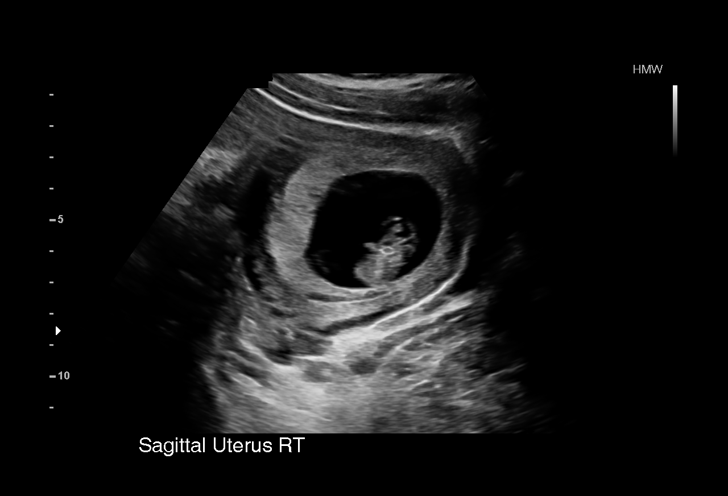
[im 6/36]
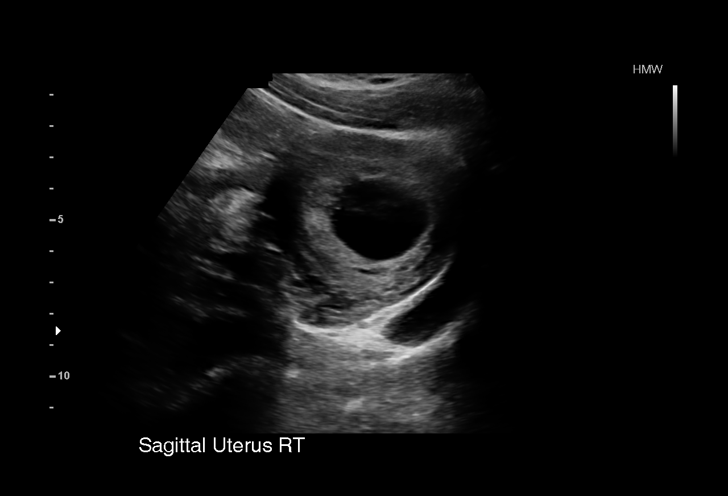
[im 8/36]
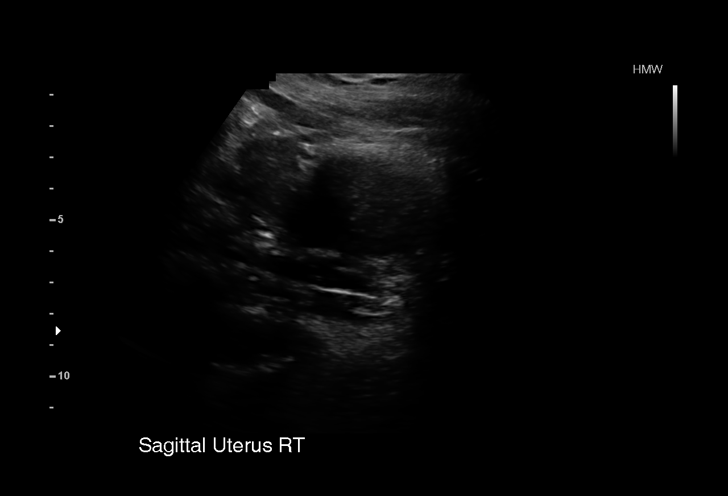
[im 11/36]
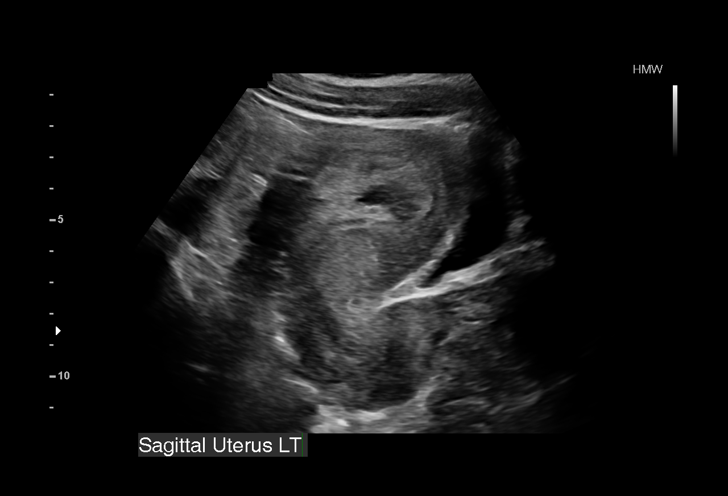
[im 13/36]
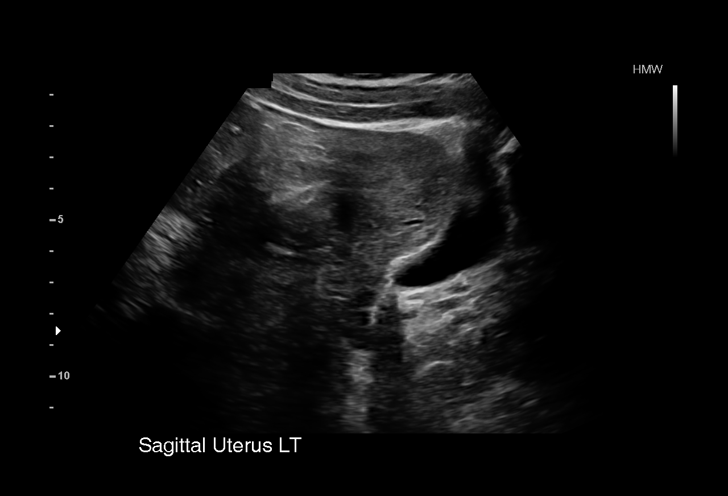
[im 16/36]
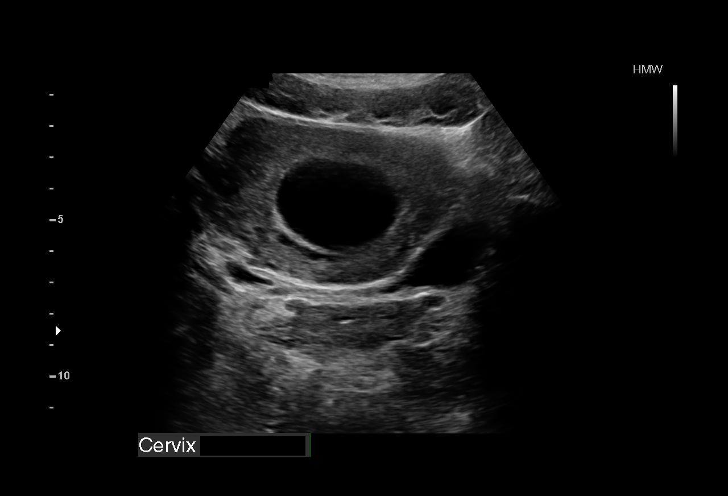
[im 19/36]
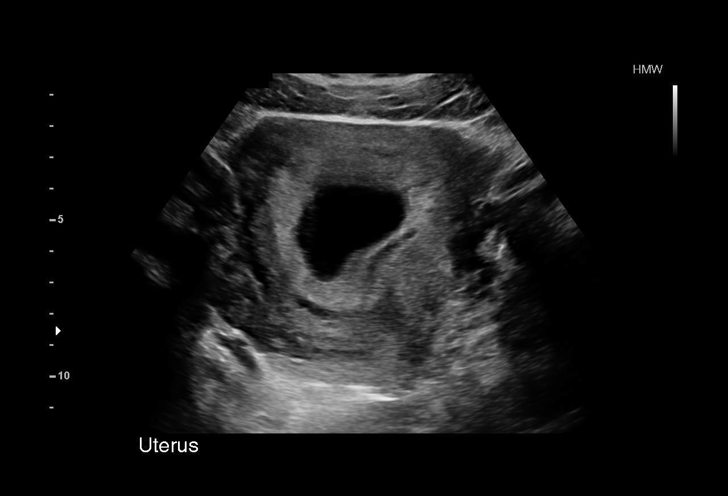
[im 20/36]
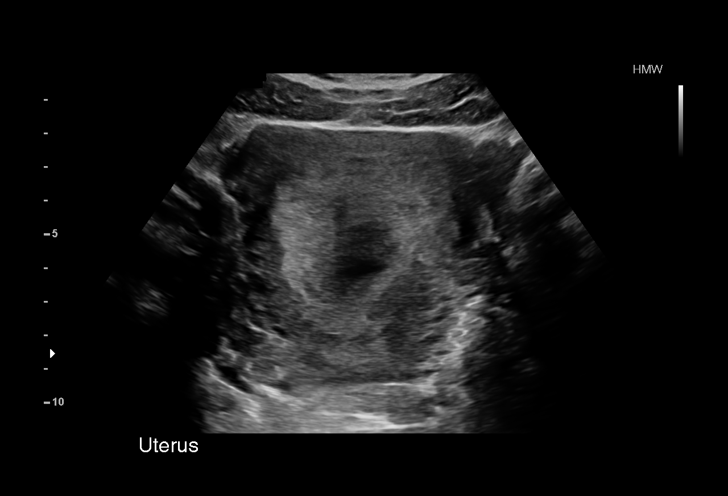
[im 23/36]
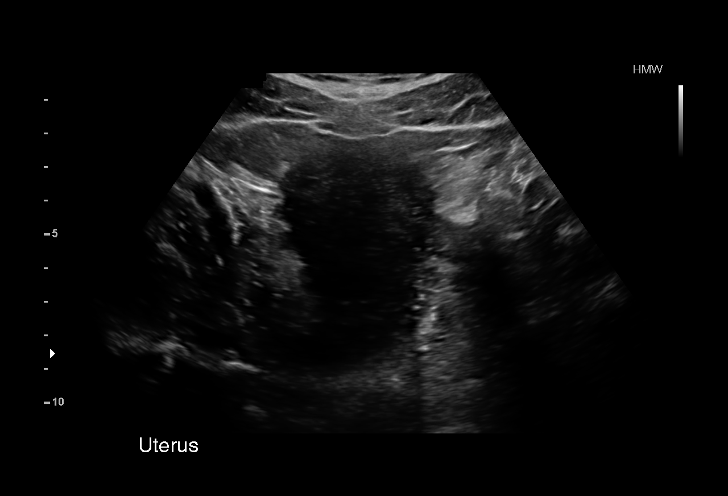
[im 25/36]
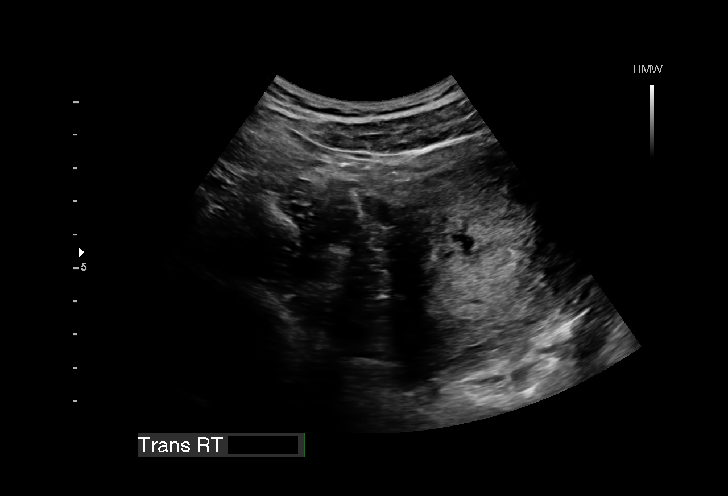
[im 28/36]
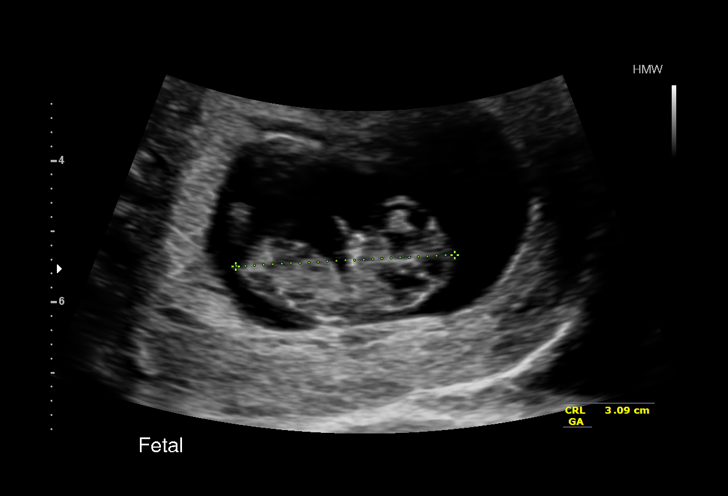
[im 30/36]
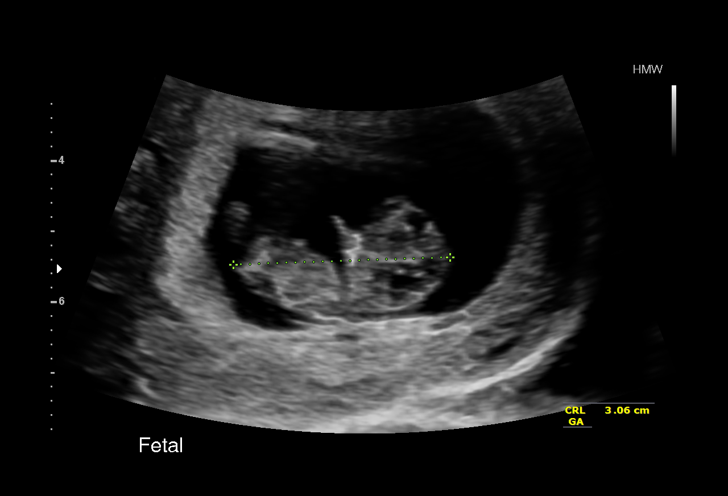
[im 33/36]
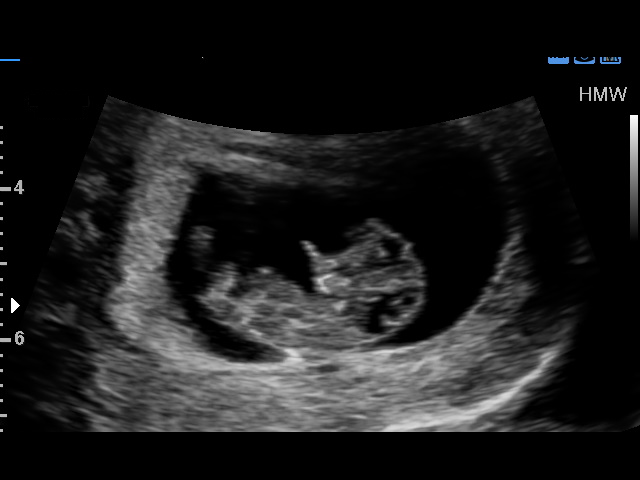
[im 36/36]
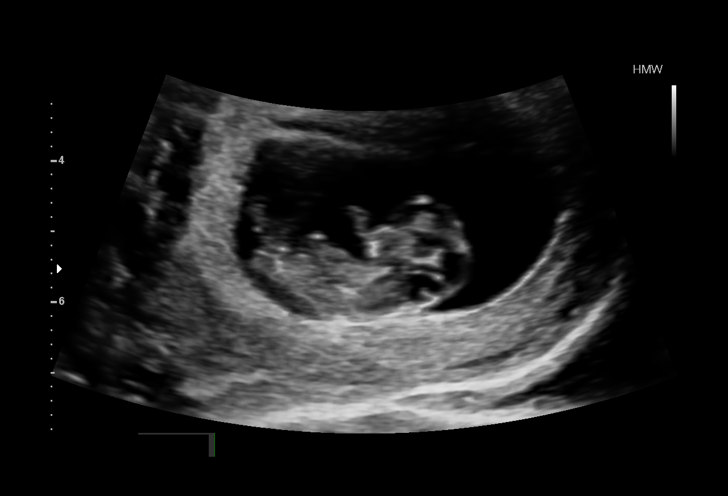

[15 of 28 positions shown; findings below may reference images not displayed]

FINDINGS: Intrauterine gestational sac: Visualized/normal in shape.

Yolk sac:  Visualized

Embryo:  Visualized

Cardiac Activity: Present

Heart Rate: 166  bpm

MSD:   mm    w     d

CRL:  31  mm   10 w   0 d                  [place] EDC: 05/27/2016

Subchorionic hemorrhage:  None visualized.

Maternal uterus/adnexae: Maternal ovaries are not seen but there is
no mass or free fluid identified within either adnexal region. No
free fluid in the cul-de-sac.
IMPRESSION: Single live intrauterine pregnancy with estimated gestational age of
10 weeks and 0 days by crown-rump length measurement.

Fetal heart rate measured at 166 beats per minute.

## 2023-02-09 ENCOUNTER — Encounter (HOSPITAL_BASED_OUTPATIENT_CLINIC_OR_DEPARTMENT_OTHER): Payer: Self-pay

## 2023-02-09 ENCOUNTER — Other Ambulatory Visit (HOSPITAL_BASED_OUTPATIENT_CLINIC_OR_DEPARTMENT_OTHER): Payer: Self-pay

## 2023-02-09 MED ORDER — WEGOVY 0.25 MG/0.5ML ~~LOC~~ SOAJ
0.2500 mg | SUBCUTANEOUS | 0 refills | Status: DC
  Filled 2023-02-09 – 2023-04-08 (×2): qty 2, 28d supply, fill #0

## 2023-04-08 ENCOUNTER — Other Ambulatory Visit (HOSPITAL_BASED_OUTPATIENT_CLINIC_OR_DEPARTMENT_OTHER): Payer: Self-pay

## 2023-04-08 ENCOUNTER — Other Ambulatory Visit (HOSPITAL_COMMUNITY): Payer: Self-pay

## 2023-06-04 ENCOUNTER — Ambulatory Visit
Admission: EM | Admit: 2023-06-04 | Discharge: 2023-06-04 | Disposition: A | Discharge status: Home / Self Care | Payer: No Typology Code available for payment source

## 2023-06-04 DIAGNOSIS — R197 Diarrhea, unspecified: Secondary | ICD-10-CM | POA: Diagnosis not present

## 2023-06-04 MED ORDER — OMEPRAZOLE 20 MG PO CPDR: 20.0000 mg | DELAYED_RELEASE_CAPSULE | Freq: Two times a day (BID) | ORAL | 0 refills | Status: AC

## 2023-06-04 NOTE — ED Provider Notes (Signed)
[facility]    CSN: [id number] Arrival date & time: 06/04/23  6578      History   Chief Complaint No chief complaint on file.   HPI Breanna Holmes is a 32 y.o. female.   32 yr old female who presents to urgent care with diarrhea that has been present of 3 days. She had nausea and vomiting on Monday but that resolved. She has not been around anyone sick that she knows of recently. Reports some issues with feeling like she has reflux and reports no appetite due to this. She is not eating much at all. Drinking water without issues. Stool was not bloody but reports it did seem dark brown/green. No hx of intestinal issues in the past. Denies fevers, chills, upper respiratory symptoms. Feels fine otherwise.      No past medical history on file.  There are no problems to display for this patient.   No past surgical history on file.  OB History     Gravida  1   Para      Term      Preterm      AB      Living         SAB      IAB      Ectopic      Multiple      Live Births               Home Medications    Prior to Admission medications   Medication Sig Start Date End Date Taking? Authorizing Provider  Prenatal Vit-Fe Fumarate-FA (PRENATAL MULTIVITAMIN) TABS tablet Take 1 tablet by mouth daily at 12 noon.    [provider]  promethazine (PHENERGAN) 25 MG tablet TAKE ONE-HALF TO ONE TABLET BY MOUTH EVERY 6 HOURS AS NEEDED 11/17/15   Armando Reichert, CNM  Semaglutide-Weight Management (WEGOVY) 0.25 MG/0.5ML SOAJ Inject 0.25 mg into the skin once a week. administer weeks 1 through 4 of therapy 02/09/23       Family History Family History  Problem Relation Age of Onset   Hypertension Mother    Hypertension Maternal Grandfather     Social History Social History   Tobacco Use   Smoking status: Never  Substance Use Topics   Alcohol use: No   Drug use: No     Allergies   Patient has no known allergies.   Review of  Systems Review of Systems  Constitutional:  Positive for appetite change. Negative for chills and fever.  HENT:  Negative for ear pain and sore throat.   Eyes:  Negative for pain and visual disturbance.  Respiratory:  Negative for cough and shortness of breath.   Cardiovascular:  Negative for chest pain and palpitations.  Gastrointestinal:  Positive for diarrhea and nausea. Negative for abdominal pain, blood in stool, constipation and vomiting.  Genitourinary:  Negative for dysuria, hematuria, vaginal discharge and vaginal pain.  Musculoskeletal:  Negative for arthralgias and back pain.  Skin:  Negative for color change and rash.  Neurological:  Negative for seizures, syncope and headaches.  All other systems reviewed and are negative.    Physical Exam Triage Vital Signs ED Triage Vitals  Encounter Vitals Group     BP      Systolic BP Percentile      Diastolic BP Percentile      Pulse      Resp      Temp      Temp src  SpO2      Weight      Height      Head Circumference      Peak Flow      Pain Score      Pain Loc      Pain Education      Exclude from Growth Chart    No data found.  Updated Vital Signs There were no vitals taken for this visit.  Visual Acuity Right Eye Distance:   Left Eye Distance:   Bilateral Distance:    Right Eye Near:   Left Eye Near:    Bilateral Near:     Physical Exam Vitals and nursing note reviewed.  Constitutional:      General: She is not in acute distress.    Appearance: She is well-developed.  HENT:     Head: Normocephalic and atraumatic.  Eyes:     Conjunctiva/sclera: Conjunctivae normal.  Cardiovascular:     Rate and Rhythm: Normal rate and regular rhythm.     Heart sounds: No murmur heard. Pulmonary:     Effort: Pulmonary effort is normal. No respiratory distress.     Breath sounds: Normal breath sounds.  Abdominal:     Palpations: Abdomen is soft.     Tenderness: There is no abdominal tenderness.   Musculoskeletal:        General: No swelling.     Cervical back: Neck supple.  Skin:    General: Skin is warm and dry.     Capillary Refill: Capillary refill takes less than 2 seconds.  Neurological:     Mental Status: She is alert.  Psychiatric:        Mood and Affect: Mood normal.      UC Treatments / Results  Labs (all labs ordered are listed, but only abnormal results are displayed) Labs Reviewed - No data to display  EKG   Radiology No results found.  Procedures Procedures (including critical care time)  Medications Ordered in UC Medications - No data to display  Initial Impression / Assessment and Plan / UC Course  I have reviewed the triage vital signs and the nursing notes.  Pertinent labs & imaging results that were available during my care of the patient were reviewed by me and considered in my medical decision making (see chart for details).     Diarrhea, unspecified type   Symptoms may be infectious vs acid reflux causing decreased appetite. Will allow to run its course but will try Prilosec twice daily for 2 weeks, then daily. If symptoms recur after decreasing dose, may need to consider seeing PCP. Return to urgent care or PCP if symptoms worsen or fail to resolve.  Stay hydrated, eat when able.   Final Clinical Impressions(s) / UC Diagnoses   Final diagnoses:  None   Discharge Instructions   None    ED Prescriptions   None    PDMP not reviewed this encounter.   Landis Martins, [place] 06/04/23 [phone]

## 2023-06-04 NOTE — Discharge Instructions (Addendum)
Symptoms may be infectious vs acid reflux causing decreased appetite. Will allow to run its course but will try Prilosec twice daily for 2 weeks, then daily. If symptoms recur after decreasing dose, may need to consider seeing PCP. Return to urgent care or PCP if symptoms worsen or fail to resolve.  Stay hydrated, eat when able.

## 2023-06-04 NOTE — ED Triage Notes (Signed)
Patient here today with c/o diarrhea X 3 days. Patient had N&V on Monday one time but that went away. Denies taking anything for symptoms. No sick contacts.

## 2023-10-12 ENCOUNTER — Ambulatory Visit
Admission: EM | Admit: 2023-10-12 | Discharge: 2023-10-12 | Disposition: A | Discharge status: Home / Self Care | Payer: Medicaid Other | Attending: Family Medicine | Admitting: Family Medicine

## 2023-10-12 DIAGNOSIS — R102 Pelvic and perineal pain: Secondary | ICD-10-CM | POA: Diagnosis present

## 2023-10-12 DIAGNOSIS — Z202 Contact with and (suspected) exposure to infections with a predominantly sexual mode of transmission: Secondary | ICD-10-CM | POA: Insufficient documentation

## 2023-10-12 DIAGNOSIS — N760 Acute vaginitis: Secondary | ICD-10-CM | POA: Diagnosis not present

## 2023-10-12 DIAGNOSIS — L739 Follicular disorder, unspecified: Secondary | ICD-10-CM | POA: Diagnosis present

## 2023-10-12 MED ORDER — CEPHALEXIN 250 MG PO CAPS: 250.0000 mg | ORAL_CAPSULE | Freq: Three times a day (TID) | ORAL | 0 refills | Status: AC
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
## 2023-10-12 NOTE — Discharge Instructions (Signed)
 Staff will notify you if there is anything positive on the swabs or on the blood work.  Take cephalexin 250 mg--1 capsule 3 times daily for 7 days

## 2023-10-12 NOTE — ED Triage Notes (Signed)
 Here in room with sister. "I think I may have a yeast infection, had sex about a week ago, used condom but I think I may have gotten dry and have irritation now, some pink noticed in the discharge". "I would like BV testing as well as STI testing (to include blood work)".

## 2023-10-13 ENCOUNTER — Telehealth: Payer: Self-pay

## 2023-10-13 LAB — CERVICOVAGINAL ANCILLARY ONLY
Bacterial Vaginitis (gardnerella): NEGATIVE
Candida Glabrata: NEGATIVE
Candida Vaginitis: POSITIVE — AB
Chlamydia: NEGATIVE
Comment: NEGATIVE
Comment: NEGATIVE
Comment: NEGATIVE
Comment: NEGATIVE
Comment: NEGATIVE
Comment: NORMAL
Neisseria Gonorrhea: NEGATIVE
Trichomonas: NEGATIVE

## 2023-10-13 LAB — RPR: RPR Ser Ql: NONREACTIVE

## 2023-10-13 LAB — HIV ANTIBODY (ROUTINE TESTING W REFLEX): HIV Screen 4th Generation wRfx: NONREACTIVE

## 2023-10-13 MED ORDER — FLUCONAZOLE 150 MG PO TABS: 150.0000 mg | ORAL_TABLET | Freq: Once | ORAL | 0 refills | Status: AC

## 2023-10-13 NOTE — Telephone Encounter (Signed)
 Per protocol, pt requires tx with Diflucan.  Rx sent to pharmacy on file.

## 2023-10-16 LAB — HSV CULTURE AND TYPING

## 2024-08-16 ENCOUNTER — Encounter: Payer: Self-pay | Admitting: Emergency Medicine

## 2024-08-16 ENCOUNTER — Ambulatory Visit: Admission: EM | Admit: 2024-08-16 | Discharge: 2024-08-16 | Disposition: A | Discharge status: Home / Self Care

## 2024-08-16 DIAGNOSIS — N760 Acute vaginitis: Secondary | ICD-10-CM | POA: Diagnosis not present

## 2024-08-16 MED ORDER — METRONIDAZOLE 500 MG PO TABS: 500.0000 mg | ORAL_TABLET | Freq: Two times a day (BID) | ORAL | 0 refills | Status: AC

## 2024-08-16 MED ORDER — FLUCONAZOLE 150 MG PO TABS: ORAL_TABLET | ORAL | 1 refills | Status: AC
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
## 2024-08-16 NOTE — ED Triage Notes (Signed)
 Pt presents requesting STD testing including blood work. Pt reports sxs as vaginal itch and discharge. Last sexual encounter 05/2024.

## 2024-08-17 LAB — SYPHILIS: RPR W/REFLEX TO RPR TITER AND TREPONEMAL ANTIBODIES, TRADITIONAL SCREENING AND DIAGNOSIS ALGORITHM: RPR Ser Ql: NONREACTIVE

## 2024-08-17 LAB — HIV ANTIBODY (ROUTINE TESTING W REFLEX): HIV Screen 4th Generation wRfx: NONREACTIVE

## 2024-08-18 ENCOUNTER — Ambulatory Visit (HOSPITAL_COMMUNITY): Payer: Self-pay

## 2024-08-18 LAB — CERVICOVAGINAL ANCILLARY ONLY
Bacterial Vaginitis (gardnerella): NEGATIVE
Candida Glabrata: NEGATIVE
Candida Vaginitis: POSITIVE — AB
Chlamydia: NEGATIVE
Comment: NEGATIVE
Comment: NEGATIVE
Comment: NEGATIVE
Comment: NEGATIVE
Comment: NEGATIVE
Comment: NORMAL
Neisseria Gonorrhea: NEGATIVE
Trichomonas: NEGATIVE
